# Patient Record
Sex: Female | Born: 1965 | Hispanic: No | Marital: Single | Smoking: Current every day smoker
Health system: Southern US, Community
[De-identification: ages and names within clinical notes are randomized; demographics above are authoritative.]

## PROBLEM LIST (undated history)

## (undated) DIAGNOSIS — M069 Rheumatoid arthritis, unspecified: Secondary | ICD-10-CM

---

## 2008-04-16 ENCOUNTER — Ambulatory Visit (HOSPITAL_BASED_OUTPATIENT_CLINIC_OR_DEPARTMENT_OTHER): Admission: RE | Admit: 2008-04-16 | Discharge: 2008-04-16 | Payer: Self-pay | Admitting: Orthopedic Surgery

## 2011-03-31 NOTE — Op Note (Signed)
Suzanne Vega, Suzanne Vega               ACCOUNT NO.:  [id number]   MEDICAL RECORD NO.:  [id number]          PATIENT TYPE:  AMB   LOCATION:  DSC                          FACILITY:  [facility]   PHYSICIAN:  Eulas Post, MD    DATE OF BIRTH:  [birth year]   DATE OF PROCEDURE:  04/16/2008  DATE OF DISCHARGE:                               OPERATIVE REPORT   ATTENDING PHYSICIAN:  Eulas Post, MD   PREOPERATIVE DIAGNOSIS:  Left distal fibula fracture.   POSTOPERATIVE DIAGNOSIS:  Left distal fibula fracture.   OPERATIVE PROCEDURE:  Open reduction and internal fixation, left distal  fibula.   ANESTHESIA:  General.   ESTIMATED BLOOD LOSS:  Minimal.   TOURNIQUET TIME:  47 minutes.   OPERATIVE IMPLANT:  Synthes 7 hole 01/30 tubular plate with a single lag  screw and 3 distal cancellous screws with 3 proximal cortical screws and  1 lag screw.   PREOPERATIVE INDICATIONS:  Ms. Shekinah Pitones is a 45 year old woman who  broke her left ankle.  She elected to undergo the above named procedure.  She had medial tenderness and deltoid ligament disruption and an  unstable ankle and therefore elected to undergo ORIF.  The risks,  benefits, and alternatives were discussed with her through the use of a  translator including but not limited to the risks of infection,  bleeding, nerve injury, malunion, nonunion, hardware prominence,  hardware failure, need for hardware removal, cardiopulmonary  complications, posttraumatic arthritis, stiffness, loss of function, the  need for revision surgery, and among others and she is willing to  proceed.   OPERATIVE PROCEDURE:  The patient was brought to the operating room and  placed in a supine position.  General anesthesia was administered.  A  regional block was also administered.  Antibiotics were given.  The left  lower extremity was prepped and draped and standard lateral incision was  made.  The fracture was exposed and cleaned and reduced and held with a  clamp and we placed a lag screw followed by one-third tubular plate  contoured to the distal fibula.  Excellent fixation was achieved.  The  overall bone quality was relatively poor.  The wounds  were irrigated copiously and closed with Vicryl followed by Monocryl for  the skin.  Steri-Strips were placed followed by sterile gauze and  posterior splint.  The patient was awakened and returned to the PACU in  stable and satisfactory condition.  There were no complications.  The  patient tolerated the procedure well.      Eulas Post, MD  Electronically Signed     JPL/MEDQ  D:  04/16/2008  T:  04/17/2008  Job:  [id number]

## 2015-03-21 ENCOUNTER — Emergency Department (HOSPITAL_COMMUNITY)
Admission: EM | Admit: 2015-03-21 | Discharge: 2015-03-21 | Disposition: A | Discharge status: Home / Self Care | Payer: Medicaid Other | Source: Home / Self Care | Attending: Family Medicine | Admitting: Family Medicine

## 2015-03-21 ENCOUNTER — Encounter (HOSPITAL_COMMUNITY): Payer: Self-pay | Admitting: Family Medicine

## 2015-03-21 DIAGNOSIS — M069 Rheumatoid arthritis, unspecified: Secondary | ICD-10-CM

## 2015-03-21 HISTORY — DX: Rheumatoid arthritis, unspecified: M06.9

## 2015-03-21 MED ORDER — PREDNISONE 20 MG PO TABS
60.0000 mg | ORAL_TABLET | Freq: Once | ORAL | Status: AC
  Administered 2015-03-21: 60 mg via ORAL

## 2015-03-21 MED ORDER — PREDNISONE 10 MG (48) PO TBPK: ORAL_TABLET | Freq: Every day | ORAL | Status: DC

## 2015-03-21 MED ORDER — METHOTREXATE SODIUM 10 MG PO TABS: 10.0000 mg | ORAL_TABLET | ORAL | Status: AC

## 2015-03-21 MED ORDER — PREDNISONE 20 MG PO TABS
ORAL_TABLET | ORAL | Status: AC
  Filled 2015-03-21: qty 3

## 2015-03-21 NOTE — ED Notes (Signed)
Pt states that her hands have been swollen for at least a week.

## 2015-03-21 NOTE — Discharge Instructions (Signed)
You are experiencing a flare of your rheumatoid arthritis. Please continue her methotrexate as prescribed. Please start the prednisone dose pack. Please follow-up with your new primary care physician as needed.

## 2015-03-21 NOTE — ED Provider Notes (Signed)
CSN: [id number]     Arrival date & time 03/21/15  1509 History   First MD Initiated Contact with Patient 03/21/15 1558     Chief Complaint  Patient presents with  . Joint Pain   (Consider location/radiation/quality/duration/timing/severity/associated sxs/prior Treatment) HPI  Right elbow, right and left hand pain. Primarily the joints. Associated with some swelling. Patient with history of rheumatoid arthritis. Endorses being on methotrexate in the past with use of steroids for flares but patient is between her care physicians due to a change in Medicaid. New physician appointment has not been set yet and previous physician will not prescribe any further medications. Patient denies fevers, chest pain, palpitations, rash, headache, nausea, vomiting, diarrhea.   Endorses that her current symptoms are typical for her rheumatoid arthritis flares.   Past Medical History  Diagnosis Date  . Rheumatoid arthritis    History reviewed. No pertinent past surgical history. No family history on file. History  Substance Use Topics  . Smoking status: Not on file  . Smokeless tobacco: Not on file  . Alcohol Use: Not on file   OB History    No data available     Review of Systems Per HPI with all other pertinent systems negative.   Allergies  Review of patient's allergies indicates not on file.  Home Medications   Prior to Admission medications   Medication Sig Start Date End Date Taking? Authorizing Provider  methotrexate (RHEUMATREX) 10 MG tablet Take 1 tablet (10 mg total) by mouth once a week. Caution: Chemotherapy. Protect from light. 03/21/15   Ozella Rocks, MD  predniSONE (STERAPRED UNI-PAK 48 TAB) 10 MG (48) TBPK tablet Take by mouth daily. Take as instructed 03/21/15   Ozella Rocks, MD   BP 127/77 mmHg  Pulse 95  Temp(Src) 98.8 F (37.1 C) (Oral)  Resp 20  SpO2 97% Physical Exam Physical Exam  Constitutional: oriented to person, place, and time. appears well-developed and  well-nourished. No distress.  HENT:  Head: Normocephalic and atraumatic.  Eyes: EOMI. PERRL.  Neck: Normal range of motion.  Cardiovascular: RRR, no m/r/g, 2+ distal pulses,  Pulmonary/Chest: Effort normal and breath sounds normal. No respiratory distress.  Abdominal: Soft. Bowel sounds are normal. NonTTP, no distension.  Musculoskeletal: significant swelling of the MCP and PIPs of the left and right hands. Right greater than left. Tender to palpation. No induration or significant erythema. Difficulty with flexion of the fingers due to swelling and pain.Marland Kitchen  Neurological: alert and oriented to person, place, and time.  Skin: Skin is warm. No rash noted. non diaphoretic.  Psychiatric: normal mood and affect. behavior is normal. Judgment and thought content normal.   ED Course  Procedures (including critical care time) Labs Review Labs Reviewed - No data to display  Imaging Review No results found.   MDM   1. Rheumatoid arthritis flare    Refilled methotrexate. Prednisone 60 mg given in clinic. Patient start steroid Dosepak. Patient to follow-up with her new primary care physician for further refills.    Ozella Rocks, MD 03/21/15 [phone]

## 2015-07-02 ENCOUNTER — Encounter (HOSPITAL_COMMUNITY): Payer: Self-pay | Admitting: *Deleted

## 2015-07-02 ENCOUNTER — Emergency Department (HOSPITAL_COMMUNITY)
Admission: EM | Admit: 2015-07-02 | Discharge: 2015-07-02 | Disposition: A | Discharge status: Home / Self Care | Payer: Medicaid Other | Source: Home / Self Care | Attending: Emergency Medicine | Admitting: Emergency Medicine

## 2015-07-02 DIAGNOSIS — M069 Rheumatoid arthritis, unspecified: Secondary | ICD-10-CM | POA: Diagnosis not present

## 2015-07-02 MED ORDER — METHYLPREDNISOLONE ACETATE 80 MG/ML IJ SUSP
INTRAMUSCULAR | Status: AC
  Filled 2015-07-02: qty 1

## 2015-07-02 MED ORDER — FOLIC ACID 1 MG PO TABS: 1.0000 mg | ORAL_TABLET | Freq: Every day | ORAL | Status: AC

## 2015-07-02 MED ORDER — METHYLPREDNISOLONE ACETATE 80 MG/ML IJ SUSP
80.0000 mg | Freq: Once | INTRAMUSCULAR | Status: AC
  Administered 2015-07-02: 80 mg via INTRAMUSCULAR

## 2015-07-02 MED ORDER — PREDNISONE 5 MG PO TABS: ORAL_TABLET | ORAL | Status: AC

## 2015-07-02 NOTE — ED Notes (Signed)
Pt  Ran out  Of  Her  meds       She  Also   Has  Body  Aches  And  Swelling        She  States  She  Has  No PCP  Yet  Her son is  Working on  Getting her  One

## 2015-07-02 NOTE — Discharge Instructions (Signed)
Your rheumatoid arthritis is flared up. It is very important to find a primary care doctor who can follow this. Take the prednisone as prescribed. Follow-up as needed.

## 2015-07-02 NOTE — ED Provider Notes (Signed)
CSN: [id number]     Arrival date & time 07/02/15  1300 History   First MD Initiated Contact with Patient 07/02/15 1326     Chief Complaint  Patient presents with  . Medication Refill   (Consider location/radiation/quality/duration/timing/severity/associated sxs/prior Treatment) HPI She is a 49 year old woman here for for medication refill. Her son is with her and ask as interpreter when needed. She states she ran out of her prednisone about 3 days ago. She states she has been on prednisone for several years. She states she takes 5 mg twice a day. Since she has been out of the medicine, she reports pain and swelling in multiple joints, worse in the wrist and hands. It also involves her elbows, shoulders, knees, and hips.  Past Medical History  Diagnosis Date  . Rheumatoid arthritis    History reviewed. No pertinent past surgical history. History reviewed. No pertinent family history. Social History  Substance Use Topics  . Smoking status: Current Every Day Smoker -- 0.50 packs/day    Types: Cigarettes  . Smokeless tobacco: None  . Alcohol Use: No   OB History    No data available     Review of Systems As in history of present illness Allergies  Review of patient's allergies indicates no known allergies.  Home Medications   Prior to Admission medications   Medication Sig Start Date End Date Taking? Authorizing Provider  Ibuprofen (MOTRIN PO) Take by mouth.   Yes Historical Provider, MD  folic acid (FOLVITE) 1 MG tablet Take 1 tablet (1 mg total) by mouth daily. 07/02/15   Charm Rings, MD  methotrexate (RHEUMATREX) 10 MG tablet Take 1 tablet (10 mg total) by mouth once a week. Caution: Chemotherapy. Protect from light. 03/21/15   Ozella Rocks, MD  predniSONE (DELTASONE) 5 MG tablet Take 20mg  (4 tablets) twice a day for 1 week, then 10mg  (2 tablets) twice a day for 1 week, then 5mg  (1 tablet) twice a day 07/02/15   , MD   BP 119/80 mmHg  Pulse 95  Temp(Src) 98.1 F  (36.7 C) (Oral)  Resp 16  SpO2 100% Physical Exam  Constitutional: She is oriented to person, place, and time. She appears well-developed and well-nourished. No distress.  Cardiovascular: Normal rate.   Musculoskeletal:  Hands: There appears to be some mild swelling in the MCP joints. She is diffusely tender to multiple joints.  Neurological: She is alert and oriented to person, place, and time.    ED Course  Procedures (including critical care time) Labs Review Labs Reviewed - No data to display  Imaging Review No results found.   MDM   1. Rheumatoid arthritis flare    Depo-Medrol 80 mg IM given.  We'll discharge with a prednisone burst of 40 mg daily tapered back down to her 5 mg twice a day. Her son will work on finding a primary care physician for her. Discussed that it is important to not stop the prednisone abruptly because her native steroid system is suppressed at this point.    , MD 07/02/15 [phone]

## 2016-08-21 ENCOUNTER — Emergency Department (HOSPITAL_COMMUNITY)
Admission: EM | Admit: 2016-08-21 | Discharge: 2016-08-21 | Disposition: A | Discharge status: Home / Self Care | Payer: Medicaid Other | Attending: Emergency Medicine | Admitting: Emergency Medicine

## 2016-08-21 ENCOUNTER — Emergency Department (HOSPITAL_COMMUNITY): Payer: Medicaid Other

## 2016-08-21 ENCOUNTER — Encounter (HOSPITAL_COMMUNITY): Payer: Self-pay | Admitting: Nurse Practitioner

## 2016-08-21 DIAGNOSIS — N200 Calculus of kidney: Secondary | ICD-10-CM | POA: Diagnosis not present

## 2016-08-21 DIAGNOSIS — Z79899 Other long term (current) drug therapy: Secondary | ICD-10-CM | POA: Diagnosis not present

## 2016-08-21 DIAGNOSIS — F17210 Nicotine dependence, cigarettes, uncomplicated: Secondary | ICD-10-CM | POA: Diagnosis not present

## 2016-08-21 DIAGNOSIS — R109 Unspecified abdominal pain: Secondary | ICD-10-CM | POA: Diagnosis present

## 2016-08-21 LAB — URINE MICROSCOPIC-ADD ON

## 2016-08-21 LAB — CBC
HEMATOCRIT: 38.4 % (ref 36.0–46.0)
HEMOGLOBIN: 12.6 g/dL (ref 12.0–15.0)
MCH: 28.3 pg (ref 26.0–34.0)
MCHC: 32.8 g/dL (ref 30.0–36.0)
MCV: 86.3 fL (ref 78.0–100.0)
Platelets: 505 10*3/uL — ABNORMAL HIGH (ref 150–400)
RBC: 4.45 MIL/uL (ref 3.87–5.11)
RDW: 13.9 % (ref 11.5–15.5)
WBC: 24.3 10*3/uL — AB (ref 4.0–10.5)

## 2016-08-21 LAB — URINALYSIS, ROUTINE W REFLEX MICROSCOPIC
Bilirubin Urine: NEGATIVE
GLUCOSE, UA: NEGATIVE mg/dL
Ketones, ur: NEGATIVE mg/dL
LEUKOCYTES UA: NEGATIVE
Nitrite: NEGATIVE
PROTEIN: NEGATIVE mg/dL
Specific Gravity, Urine: 1.008 (ref 1.005–1.030)
pH: 6 (ref 5.0–8.0)

## 2016-08-21 LAB — BASIC METABOLIC PANEL
ANION GAP: 7 (ref 5–15)
BUN: 9 mg/dL (ref 6–20)
CO2: 23 mmol/L (ref 22–32)
Calcium: 8.7 mg/dL — ABNORMAL LOW (ref 8.9–10.3)
Chloride: 100 mmol/L — ABNORMAL LOW (ref 101–111)
Creatinine, Ser: 0.48 mg/dL (ref 0.44–1.00)
GLUCOSE: 115 mg/dL — AB (ref 65–99)
POTASSIUM: 3.3 mmol/L — AB (ref 3.5–5.1)
Sodium: 130 mmol/L — ABNORMAL LOW (ref 135–145)

## 2016-08-21 MED ORDER — KETOROLAC TROMETHAMINE 30 MG/ML IJ SOLN
30.0000 mg | Freq: Once | INTRAMUSCULAR | Status: AC
  Administered 2016-08-21: 30 mg via INTRAVENOUS
  Filled 2016-08-21: qty 1

## 2016-08-21 MED ORDER — SODIUM CHLORIDE 0.9 % IV BOLUS (SEPSIS)
1000.0000 mL | Freq: Once | INTRAVENOUS | Status: AC
Start: 2016-08-21 — End: 2016-08-21
  Administered 2016-08-21: 1000 mL via INTRAVENOUS

## 2016-08-21 MED ORDER — TAMSULOSIN HCL 0.4 MG PO CAPS: 0.4000 mg | ORAL_CAPSULE | Freq: Every day | ORAL | 0 refills | Status: AC

## 2016-08-21 MED ORDER — HYDROCODONE-ACETAMINOPHEN 5-325 MG PO TABS: 1.0000 | ORAL_TABLET | Freq: Four times a day (QID) | ORAL | 0 refills | Status: AC | PRN

## 2016-08-21 MED ORDER — ONDANSETRON HCL 4 MG/2ML IJ SOLN
4.0000 mg | Freq: Once | INTRAMUSCULAR | Status: AC
  Administered 2016-08-21: 4 mg via INTRAVENOUS
  Filled 2016-08-21: qty 2

## 2016-08-21 NOTE — ED Notes (Signed)
Bladder scan reads 117 mls

## 2016-08-21 NOTE — ED Notes (Signed)
Bed: BJ62 Expected date:  Expected time:  Means of arrival:  Comments: EMS-flank pain

## 2016-08-21 NOTE — Discharge Instructions (Signed)
You need to strain your urine with the strainer.  Please take the stone to your doctor or to the urologist.  If your symptoms worsen, or if you have fever, vomiting, or uncontrolled pain please return to the ER.

## 2016-08-21 NOTE — ED Triage Notes (Signed)
Pt presents with c/o severe left flank pain, sudden onset and with associated sx of nausea.

## 2016-08-21 NOTE — ED Provider Notes (Signed)
[facility]-EMERGENCY DEPT Provider Note   CSN: [id number] Arrival date & time: 08/21/16  1609     History   Chief Complaint Chief Complaint  Patient presents with  . Flank Pain    HPI Suzanne Vega is a 50 y.o. female.  Patient presents to the ED with a chief complaint of sudden onset left flank pain.  She states that the symptoms were sudden and severe.  She states that the pain made her scream and cry.  She states that she was given some medication by EMS with good relief.  She reports feeling urinary hesitancy, but denies any dysuria or hematuria.  She denies any history of kidney stones.  She denies and vomiting, but has had some nausea.  There are no other associated symptoms.   The history is provided by the patient. No language interpreter was used.    Past Medical History:  Diagnosis Date  . Rheumatoid arthritis (HCC)     There are no active problems to display for this patient.   History reviewed. No pertinent surgical history.  OB History    No data available       Home Medications    Prior to Admission medications   Medication Sig Start Date End Date Taking? Authorizing Provider  FLUCELVAX QUADRIVALENT 0.5 ML SUSY TO BE ADMINISTERED BY PHARMACIST FOR IMMUNIZATION 06/23/16  Yes Historical Provider, MD  folic acid (FOLVITE) 1 MG tablet Take 1 tablet (1 mg total) by mouth daily. 07/02/15  Yes Charm Rings, MD  LYRICA 75 MG capsule Take 75 mg by mouth 3 (three) times daily. 08/10/16  Yes Historical Provider, MD  methotrexate (RHEUMATREX) 2.5 MG tablet Take 15 mg by mouth once a week. Every Thursday. 07/26/16  Yes Historical Provider, MD  predniSONE (DELTASONE) 5 MG tablet Take 10 mg by mouth daily. 06/23/16  Yes Historical Provider, MD  inFLIXimab (REMICADE) 100 MG injection Inject 300 mg into the vein every 8 (eight) weeks. 06/23/16   Historical Provider, MD  methotrexate (RHEUMATREX) 10 MG tablet Take 1 tablet (10 mg total) by mouth once a week. Caution: Chemotherapy.  Protect from light. Patient not taking: Reported on 08/21/2016 03/21/15   Ozella Rocks, MD  predniSONE (DELTASONE) 5 MG tablet Take 20mg  (4 tablets) twice a day for 1 week, then 10mg  (2 tablets) twice a day for 1 week, then 5mg  (1 tablet) twice a day 07/02/15   , MD    Family History History reviewed. No pertinent family history.  Social History Social History  Substance Use Topics  . Smoking status: Current Every Day Smoker    Packs/day: 0.50    Types: Cigarettes  . Smokeless tobacco: Not on file  . Alcohol use No     Allergies   Methotrexate   Review of Systems Review of Systems  Genitourinary: Positive for flank pain.       Urinary hesitancy  All other systems reviewed and are negative.    Physical Exam Updated Vital Signs BP 120/80 (BP Location: Right Arm)   Pulse 94   Temp 98.6 F (37 C) (Oral)   Resp 18   SpO2 93%   Physical Exam  Constitutional: She is oriented to person, place, and time. She appears well-developed and well-nourished.  HENT:  Head: Normocephalic and atraumatic.  Eyes: Conjunctivae and EOM are normal. Pupils are equal, round, and reactive to light.  Neck: Normal range of motion. Neck supple.  Cardiovascular: Normal rate and regular rhythm.  Exam reveals no  gallop and no friction rub.   No murmur heard. Pulmonary/Chest: Effort normal and breath sounds normal. No respiratory distress. She has no wheezes. She has no rales. She exhibits no tenderness.  Abdominal: Soft. Bowel sounds are normal. She exhibits no distension and no mass. There is no tenderness. There is no rebound and no guarding.  No focal abdominal tenderness, no RLQ tenderness or pain at McBurney's point, no RUQ tenderness or Murphy's sign, no left-sided abdominal tenderness, no fluid wave, or signs of peritonitis   Musculoskeletal: Normal range of motion. She exhibits no edema or tenderness.  Neurological: She is alert and oriented to person, place, and time.  Skin:  Skin is warm and dry.  Psychiatric: She has a normal mood and affect. Her behavior is normal. Judgment and thought content normal.  Nursing note and vitals reviewed.    ED Treatments / Results  Labs (all labs ordered are listed, but only abnormal results are displayed) Labs Reviewed  URINALYSIS, ROUTINE W REFLEX MICROSCOPIC (NOT AT [facility])  BASIC METABOLIC PANEL  CBC    EKG  EKG Interpretation None       Radiology No results found.  Procedures Procedures (including critical care time)  Medications Ordered in ED Medications  ketorolac (TORADOL) 30 MG/ML injection 30 mg (not administered)  ondansetron (ZOFRAN) injection 4 mg (not administered)     Initial Impression / Assessment and Plan / ED Course  I have reviewed the triage vital signs and the nursing notes.  Pertinent labs & imaging results that were available during my care of the patient were reviewed by me and considered in my medical decision making (see chart for details).  Clinical Course    Patient with sudden onset left flank pain. Symptoms concerning for [place].  Will give some toradol and check CT renal study.  CT scan consistent with kidney stone. No evidence of infection. Patient seen by and discussed with Dr. Eudelia Bunch, who recommends discharge with Flomax and pain medicine.  Urine sent for culture.  VSS afebrile, doubt infection.   Final Clinical Impressions(s) / ED Diagnoses   Final diagnoses:  Kidney stone    New Prescriptions New Prescriptions   HYDROCODONE-ACETAMINOPHEN (NORCO/VICODIN) 5-325 MG TABLET    Take 1-2 tablets by mouth every 6 (six) hours as needed.   TAMSULOSIN (FLOMAX) 0.4 MG CAPS CAPSULE    Take 1 capsule (0.4 mg total) by mouth daily after breakfast.     Roxy Horseman, PA-C 08/21/16 1903    Nira Conn, MD 08/21/16 [phone]

## 2016-08-21 NOTE — ED Notes (Signed)
Pt states she would like to leave now rather than wait till IV fluids are finished.

## 2016-08-23 LAB — URINE CULTURE: Culture: NO GROWTH

## 2018-04-10 IMAGING — CT CT RENAL STONE PROTOCOL
2 of 3 series · 16 of 46 positions shown, 18 images · non-contrast
Comparison: Ultrasound pelvis 08/03/2013

CLINICAL DATA: Severe left flank pain.  Sudden onset.  Nausea.

EXAM:
CT ABDOMEN AND PELVIS WITHOUT CONTRAST
TECHNIQUE: Multidetector CT imaging of the abdomen and pelvis was performed
following the standard protocol without IV contrast.

[Series 4: lung · axial · 0.74mm/px · z∈[+1318,+1406]mm · 13 of 52 slices shown, 15 images]
[im 4/52  soft-tissue]
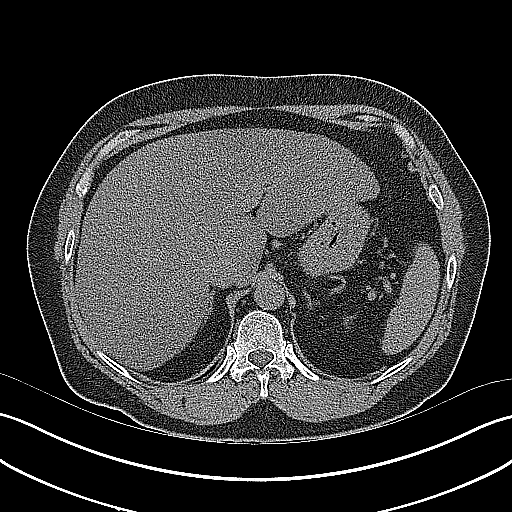
[im 4/52  bone]
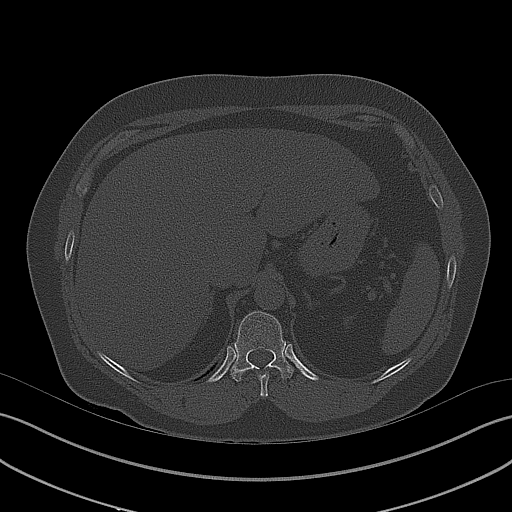
[im 7/52  soft-tissue]
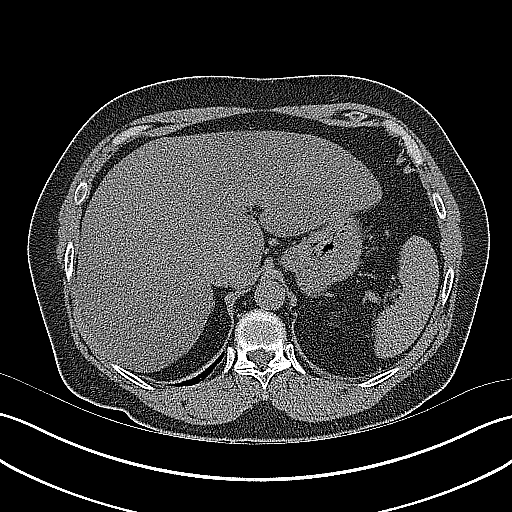
[im 10/52  soft-tissue]
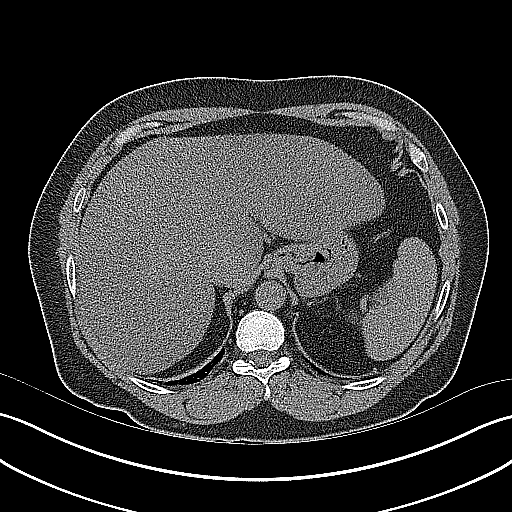
[im 15/52  soft-tissue]
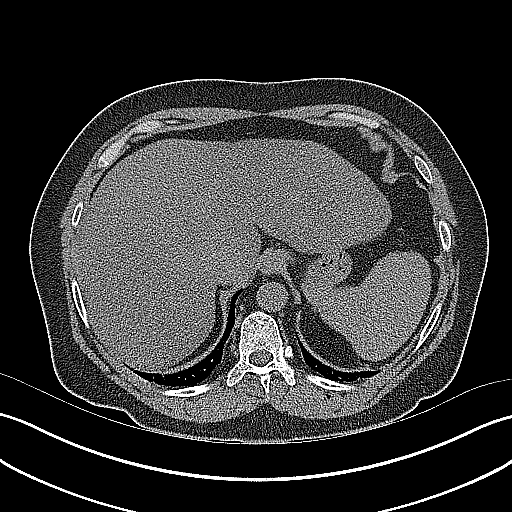
[im 19/52  soft-tissue]
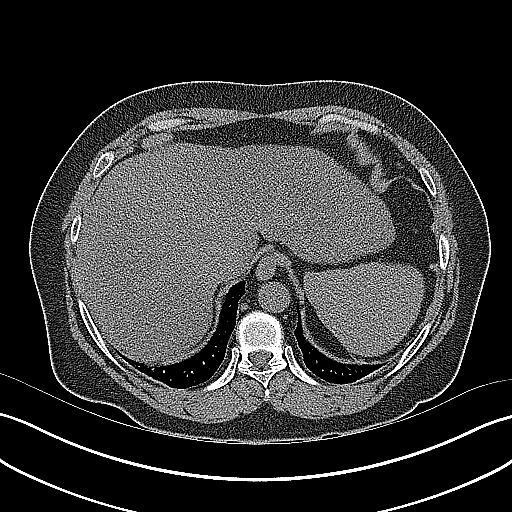
[im 22/52  soft-tissue]
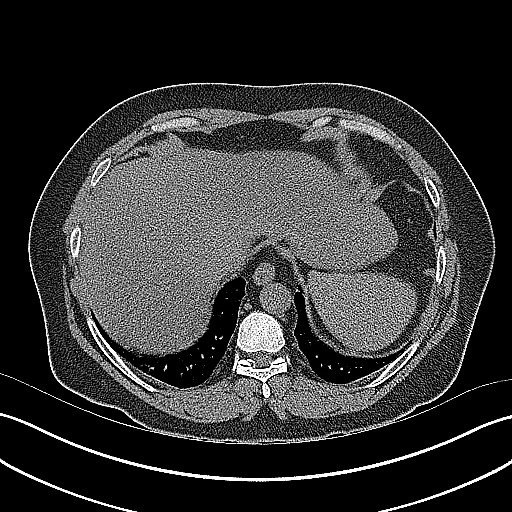
[im 27/52  soft-tissue]
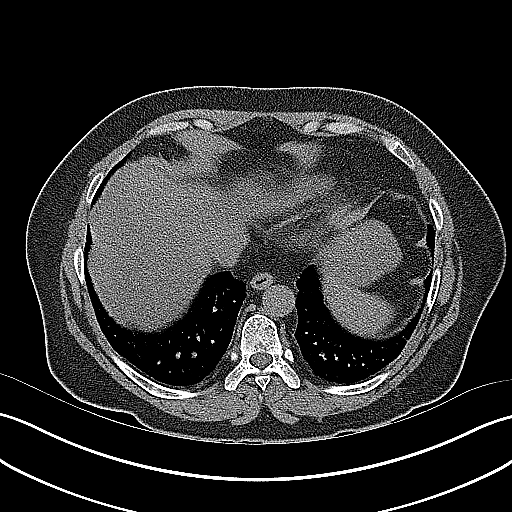
[im 30/52  soft-tissue]
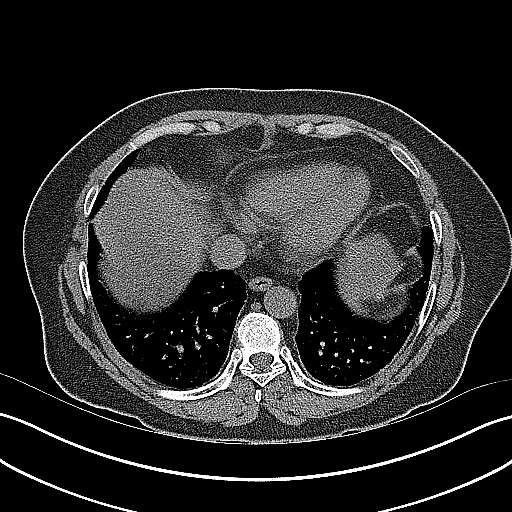
[im 33/52  soft-tissue]
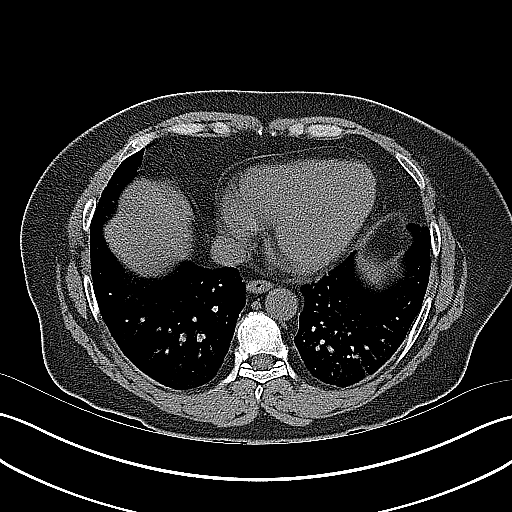
[im 33/52  bone]
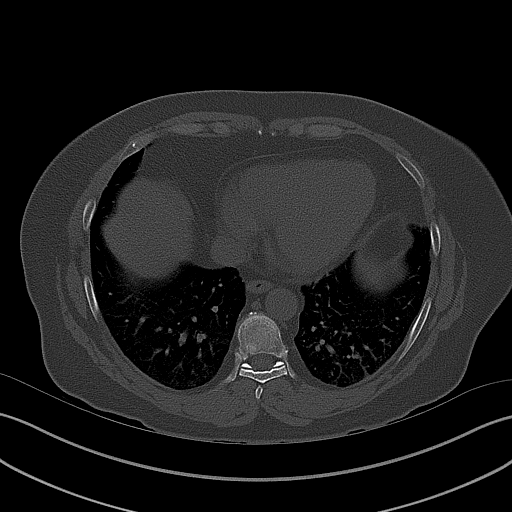
[im 37/52  soft-tissue]
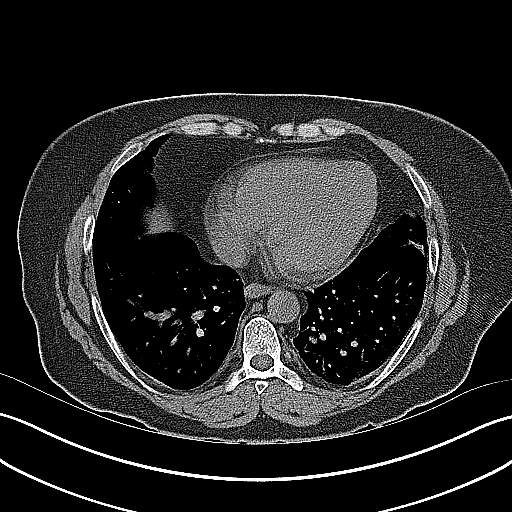
[im 42/52  soft-tissue]
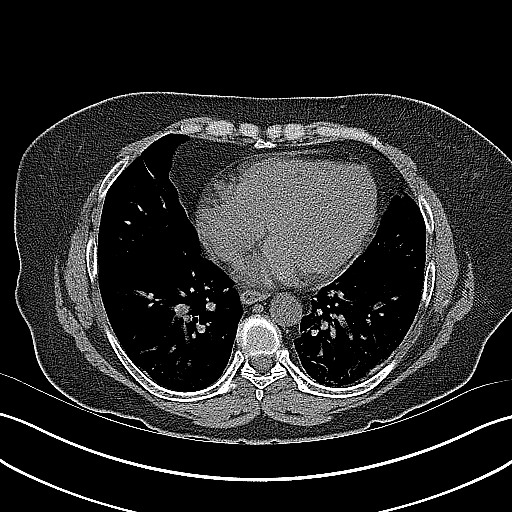
[im 45/52  soft-tissue]
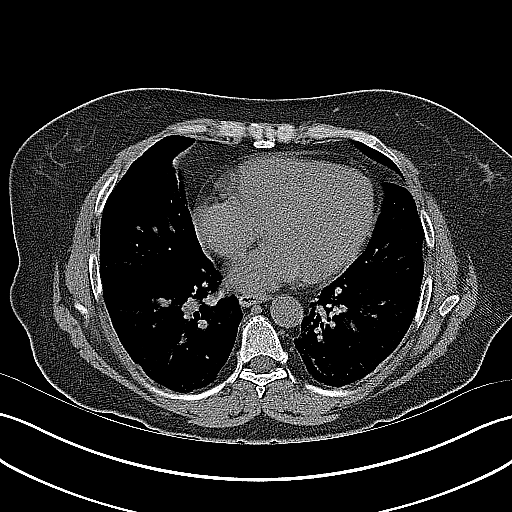
[im 48/52  soft-tissue]
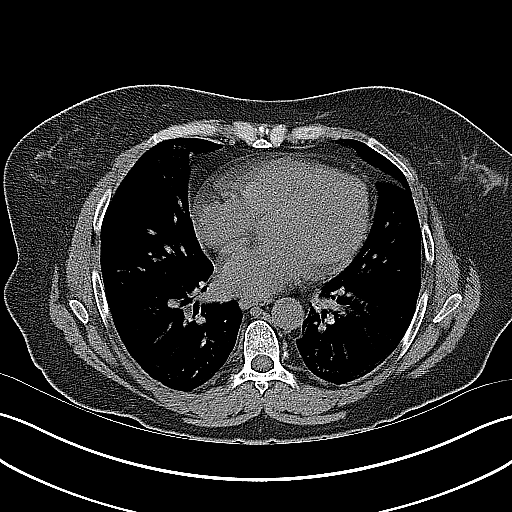

[Series 5: coronal · coronal · 0.74mm/px · 3 of 143 slices shown]
[im 48/143  soft-tissue]
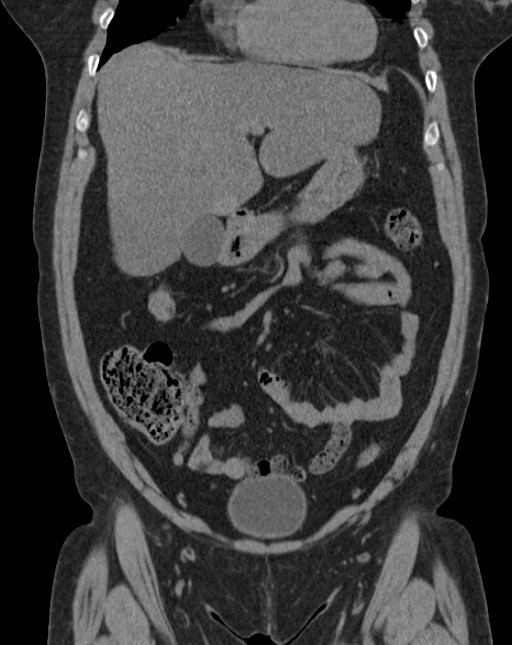
[im 64/143  soft-tissue]
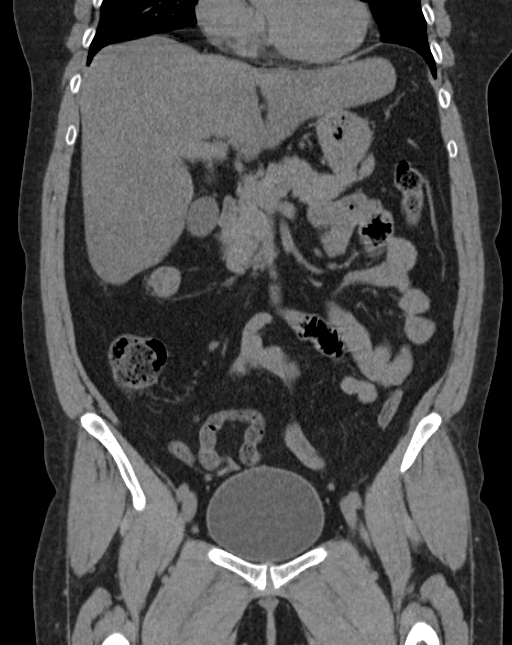
[im 79/143  soft-tissue]
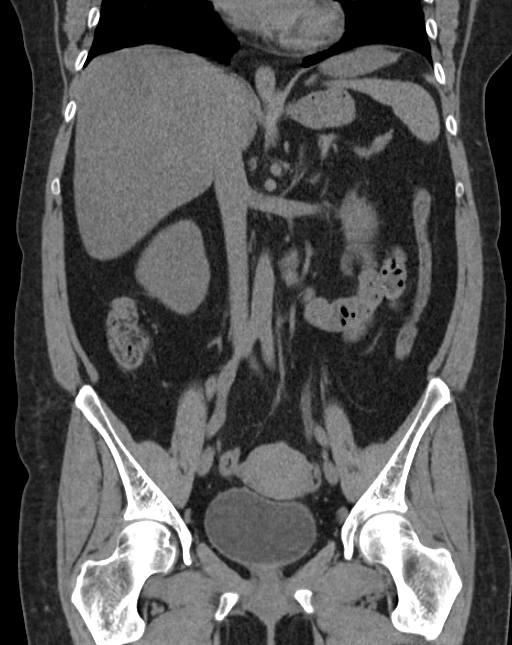

[16 of 46 positions shown; findings below may reference images not displayed]

FINDINGS: Lower chest: Atelectasis in the lung bases.

Hepatobiliary: Unenhanced appearance is normal

Pancreas: Unenhanced appearance is normal

Spleen: Unenhanced appearance is normal

Adrenals/Urinary Tract: No adrenal gland nodules. 3 mm stone in the
distal left ureter at the ureterovesical junction. Mild left
hydronephrosis and hydroureter with mild stranding around the ureter
and renal collecting system. No additional stones demonstrated. Left
kidney and ureter are decompressed. No bladder wall thickening or
bladder stones.

Stomach/Bowel: Stomach and small bowel are decompressed. Scattered
stool throughout the colon which is mainly decompressed. No
inflammatory changes are appreciated. Appendix is normal.

Vascular/Lymphatic: Normal caliber abdominal aorta with small
calcification. No significant lymphadenopathy.

Reproductive: Uterus and ovaries are not enlarged.

Other: No free air or free fluid in the abdomen. Abdominal wall
musculature appears intact.

Musculoskeletal: No destructive bone lesions.
IMPRESSION: 3 mm stone in the distal left ureter with mild proximal obstruction.
